# Patient Record
Sex: Female | Born: 1950 | Race: Black or African American | Hispanic: No | Smoking: Never smoker
Health system: Southern US, Community
[De-identification: ages and names within clinical notes are randomized; demographics above are authoritative.]

## PROBLEM LIST (undated history)

## (undated) DIAGNOSIS — E7800 Pure hypercholesterolemia, unspecified: Secondary | ICD-10-CM

## (undated) DIAGNOSIS — E119 Type 2 diabetes mellitus without complications: Secondary | ICD-10-CM

## (undated) DIAGNOSIS — M1990 Unspecified osteoarthritis, unspecified site: Secondary | ICD-10-CM

## (undated) DIAGNOSIS — J45909 Unspecified asthma, uncomplicated: Secondary | ICD-10-CM

## (undated) DIAGNOSIS — M329 Systemic lupus erythematosus, unspecified: Secondary | ICD-10-CM

## (undated) DIAGNOSIS — I10 Essential (primary) hypertension: Secondary | ICD-10-CM

## (undated) DIAGNOSIS — K5792 Diverticulitis of intestine, part unspecified, without perforation or abscess without bleeding: Secondary | ICD-10-CM

## (undated) DIAGNOSIS — IMO0002 Reserved for concepts with insufficient information to code with codable children: Secondary | ICD-10-CM

## (undated) HISTORY — PX: ABDOMINAL HYSTERECTOMY: SHX81

## (undated) HISTORY — PX: ELBOW SURGERY: SHX618

## (undated) HISTORY — PX: EYE SURGERY: SHX253

## (undated) HISTORY — PX: HIP SURGERY: SHX245

## (undated) HISTORY — PX: BLADDER SURGERY: SHX569

## (undated) HISTORY — PX: CARPAL TUNNEL RELEASE: SHX101

---

## 2000-08-18 ENCOUNTER — Encounter: Admission: RE | Admit: 2000-08-18 | Discharge: 2000-08-18 | Payer: Self-pay | Admitting: Internal Medicine

## 2000-10-29 ENCOUNTER — Encounter: Admission: RE | Admit: 2000-10-29 | Discharge: 2000-10-29 | Payer: Self-pay | Admitting: Internal Medicine

## 2000-11-19 ENCOUNTER — Encounter: Admission: RE | Admit: 2000-11-19 | Discharge: 2000-11-19 | Payer: Self-pay | Admitting: Internal Medicine

## 2001-01-28 ENCOUNTER — Encounter: Payer: Self-pay | Admitting: *Deleted

## 2001-01-28 ENCOUNTER — Ambulatory Visit (HOSPITAL_COMMUNITY): Admission: RE | Admit: 2001-01-28 | Discharge: 2001-01-28 | Payer: Self-pay

## 2001-01-28 ENCOUNTER — Encounter: Admission: RE | Admit: 2001-01-28 | Discharge: 2001-01-28 | Payer: Self-pay

## 2001-03-11 ENCOUNTER — Encounter: Admission: RE | Admit: 2001-03-11 | Discharge: 2001-03-11 | Payer: Self-pay | Admitting: Internal Medicine

## 2001-04-15 ENCOUNTER — Encounter: Payer: Self-pay | Admitting: Internal Medicine

## 2001-04-15 ENCOUNTER — Encounter: Admission: RE | Admit: 2001-04-15 | Discharge: 2001-04-15 | Payer: Self-pay | Admitting: Internal Medicine

## 2001-04-15 ENCOUNTER — Ambulatory Visit (HOSPITAL_COMMUNITY): Admission: RE | Admit: 2001-04-15 | Discharge: 2001-04-15 | Payer: Self-pay | Admitting: Internal Medicine

## 2001-05-14 ENCOUNTER — Encounter: Admission: RE | Admit: 2001-05-14 | Discharge: 2001-05-14 | Payer: Self-pay | Admitting: Internal Medicine

## 2001-05-14 ENCOUNTER — Ambulatory Visit (HOSPITAL_COMMUNITY): Admission: RE | Admit: 2001-05-14 | Discharge: 2001-05-14 | Payer: Self-pay | Admitting: Internal Medicine

## 2001-05-15 ENCOUNTER — Ambulatory Visit (HOSPITAL_COMMUNITY): Admission: RE | Admit: 2001-05-15 | Discharge: 2001-05-15 | Payer: Self-pay | Admitting: *Deleted

## 2001-05-15 ENCOUNTER — Encounter: Admission: RE | Admit: 2001-05-15 | Discharge: 2001-05-15 | Payer: Self-pay | Admitting: Internal Medicine

## 2001-05-15 ENCOUNTER — Encounter: Payer: Self-pay | Admitting: *Deleted

## 2001-06-25 ENCOUNTER — Encounter: Payer: Self-pay | Admitting: Sports Medicine

## 2001-06-25 ENCOUNTER — Encounter: Admission: RE | Admit: 2001-06-25 | Discharge: 2001-06-25 | Payer: Self-pay | Admitting: Sports Medicine

## 2001-07-05 ENCOUNTER — Inpatient Hospital Stay (HOSPITAL_COMMUNITY): Admission: EM | Admit: 2001-07-05 | Discharge: 2001-07-08 | Payer: Self-pay | Admitting: Emergency Medicine

## 2001-07-05 ENCOUNTER — Encounter: Payer: Self-pay | Admitting: Emergency Medicine

## 2001-07-07 ENCOUNTER — Encounter: Payer: Self-pay | Admitting: Internal Medicine

## 2001-07-22 ENCOUNTER — Encounter: Admission: RE | Admit: 2001-07-22 | Discharge: 2001-07-22 | Payer: Self-pay

## 2001-07-28 ENCOUNTER — Encounter: Admission: RE | Admit: 2001-07-28 | Discharge: 2001-07-28 | Payer: Self-pay | Admitting: Internal Medicine

## 2001-08-14 ENCOUNTER — Encounter: Admission: RE | Admit: 2001-08-14 | Discharge: 2001-08-14 | Payer: Self-pay | Admitting: Internal Medicine

## 2001-08-17 ENCOUNTER — Encounter: Admission: RE | Admit: 2001-08-17 | Discharge: 2001-08-17 | Payer: Self-pay | Admitting: *Deleted

## 2001-08-17 ENCOUNTER — Encounter: Payer: Self-pay | Admitting: Internal Medicine

## 2001-09-01 ENCOUNTER — Encounter: Admission: RE | Admit: 2001-09-01 | Discharge: 2001-11-30 | Payer: Self-pay | Admitting: *Deleted

## 2001-09-07 ENCOUNTER — Encounter: Admission: RE | Admit: 2001-09-07 | Discharge: 2001-09-07 | Payer: Self-pay | Admitting: Internal Medicine

## 2001-10-13 ENCOUNTER — Encounter: Admission: RE | Admit: 2001-10-13 | Discharge: 2001-10-13 | Payer: Self-pay | Admitting: Internal Medicine

## 2001-11-19 ENCOUNTER — Encounter: Admission: RE | Admit: 2001-11-19 | Discharge: 2001-11-19 | Payer: Self-pay | Admitting: Internal Medicine

## 2001-12-02 ENCOUNTER — Encounter: Payer: Self-pay | Admitting: *Deleted

## 2001-12-02 ENCOUNTER — Encounter: Admission: RE | Admit: 2001-12-02 | Discharge: 2001-12-02 | Payer: Self-pay | Admitting: Internal Medicine

## 2001-12-02 ENCOUNTER — Ambulatory Visit (HOSPITAL_COMMUNITY): Admission: RE | Admit: 2001-12-02 | Discharge: 2001-12-02 | Payer: Self-pay

## 2001-12-20 ENCOUNTER — Encounter: Payer: Self-pay | Admitting: Emergency Medicine

## 2001-12-20 ENCOUNTER — Emergency Department (HOSPITAL_COMMUNITY): Admission: EM | Admit: 2001-12-20 | Discharge: 2001-12-20 | Payer: Self-pay | Admitting: Emergency Medicine

## 2001-12-22 ENCOUNTER — Ambulatory Visit (HOSPITAL_COMMUNITY): Admission: RE | Admit: 2001-12-22 | Discharge: 2001-12-22 | Payer: Self-pay | Admitting: *Deleted

## 2002-02-11 ENCOUNTER — Encounter: Payer: Self-pay | Admitting: Internal Medicine

## 2002-02-11 ENCOUNTER — Ambulatory Visit (HOSPITAL_COMMUNITY): Admission: RE | Admit: 2002-02-11 | Discharge: 2002-02-11 | Payer: Self-pay | Admitting: Internal Medicine

## 2002-03-26 ENCOUNTER — Encounter: Admission: RE | Admit: 2002-03-26 | Discharge: 2002-03-26 | Payer: Self-pay | Admitting: *Deleted

## 2002-03-26 ENCOUNTER — Ambulatory Visit (HOSPITAL_COMMUNITY): Admission: RE | Admit: 2002-03-26 | Discharge: 2002-03-26 | Payer: Self-pay | Admitting: Internal Medicine

## 2002-03-26 ENCOUNTER — Encounter: Payer: Self-pay | Admitting: Internal Medicine

## 2002-03-30 ENCOUNTER — Encounter: Admission: RE | Admit: 2002-03-30 | Discharge: 2002-03-30 | Payer: Self-pay | Admitting: Internal Medicine

## 2002-04-01 ENCOUNTER — Encounter: Payer: Self-pay | Admitting: Internal Medicine

## 2002-04-01 ENCOUNTER — Ambulatory Visit (HOSPITAL_COMMUNITY): Admission: RE | Admit: 2002-04-01 | Discharge: 2002-04-01 | Payer: Self-pay | Admitting: Internal Medicine

## 2002-04-09 ENCOUNTER — Encounter: Admission: RE | Admit: 2002-04-09 | Discharge: 2002-04-09 | Payer: Self-pay | Admitting: Internal Medicine

## 2002-05-01 ENCOUNTER — Emergency Department (HOSPITAL_COMMUNITY): Admission: EM | Admit: 2002-05-01 | Discharge: 2002-05-01 | Payer: Self-pay | Admitting: Emergency Medicine

## 2002-05-01 ENCOUNTER — Encounter: Payer: Self-pay | Admitting: Emergency Medicine

## 2002-05-04 ENCOUNTER — Encounter: Admission: RE | Admit: 2002-05-04 | Discharge: 2002-05-04 | Payer: Self-pay | Admitting: Internal Medicine

## 2013-04-03 ENCOUNTER — Emergency Department (HOSPITAL_BASED_OUTPATIENT_CLINIC_OR_DEPARTMENT_OTHER): Payer: Medicare Other

## 2013-04-03 ENCOUNTER — Encounter (HOSPITAL_BASED_OUTPATIENT_CLINIC_OR_DEPARTMENT_OTHER): Payer: Self-pay | Admitting: *Deleted

## 2013-04-03 ENCOUNTER — Emergency Department (HOSPITAL_BASED_OUTPATIENT_CLINIC_OR_DEPARTMENT_OTHER)
Admission: EM | Admit: 2013-04-03 | Discharge: 2013-04-03 | Disposition: A | Discharge status: Home / Self Care | Payer: Medicare Other | Attending: Emergency Medicine | Admitting: Emergency Medicine

## 2013-04-03 DIAGNOSIS — Z8719 Personal history of other diseases of the digestive system: Secondary | ICD-10-CM | POA: Insufficient documentation

## 2013-04-03 DIAGNOSIS — R159 Full incontinence of feces: Secondary | ICD-10-CM | POA: Insufficient documentation

## 2013-04-03 DIAGNOSIS — Z8739 Personal history of other diseases of the musculoskeletal system and connective tissue: Secondary | ICD-10-CM | POA: Insufficient documentation

## 2013-04-03 DIAGNOSIS — E119 Type 2 diabetes mellitus without complications: Secondary | ICD-10-CM | POA: Insufficient documentation

## 2013-04-03 DIAGNOSIS — I10 Essential (primary) hypertension: Secondary | ICD-10-CM | POA: Insufficient documentation

## 2013-04-03 DIAGNOSIS — IMO0001 Reserved for inherently not codable concepts without codable children: Secondary | ICD-10-CM | POA: Insufficient documentation

## 2013-04-03 DIAGNOSIS — T466X5A Adverse effect of antihyperlipidemic and antiarteriosclerotic drugs, initial encounter: Secondary | ICD-10-CM | POA: Insufficient documentation

## 2013-04-03 DIAGNOSIS — K921 Melena: Secondary | ICD-10-CM | POA: Insufficient documentation

## 2013-04-03 DIAGNOSIS — R252 Cramp and spasm: Secondary | ICD-10-CM | POA: Insufficient documentation

## 2013-04-03 DIAGNOSIS — T50905A Adverse effect of unspecified drugs, medicaments and biological substances, initial encounter: Secondary | ICD-10-CM

## 2013-04-03 DIAGNOSIS — E7800 Pure hypercholesterolemia, unspecified: Secondary | ICD-10-CM | POA: Insufficient documentation

## 2013-04-03 DIAGNOSIS — J45909 Unspecified asthma, uncomplicated: Secondary | ICD-10-CM | POA: Insufficient documentation

## 2013-04-03 HISTORY — DX: Diverticulitis of intestine, part unspecified, without perforation or abscess without bleeding: K57.92

## 2013-04-03 HISTORY — DX: Essential (primary) hypertension: I10

## 2013-04-03 HISTORY — DX: Type 2 diabetes mellitus without complications: E11.9

## 2013-04-03 HISTORY — DX: Reserved for concepts with insufficient information to code with codable children: IMO0002

## 2013-04-03 HISTORY — DX: Unspecified asthma, uncomplicated: J45.909

## 2013-04-03 HISTORY — DX: Pure hypercholesterolemia, unspecified: E78.00

## 2013-04-03 HISTORY — DX: Unspecified osteoarthritis, unspecified site: M19.90

## 2013-04-03 HISTORY — DX: Systemic lupus erythematosus, unspecified: M32.9

## 2013-04-03 LAB — COMPREHENSIVE METABOLIC PANEL
Albumin: 4 g/dL (ref 3.5–5.2)
Alkaline Phosphatase: 119 U/L — ABNORMAL HIGH (ref 39–117)
BUN: 24 mg/dL — ABNORMAL HIGH (ref 6–23)
Creatinine, Ser: 1.2 mg/dL — ABNORMAL HIGH (ref 0.50–1.10)
GFR calc Af Amer: 55 mL/min — ABNORMAL LOW (ref >90)
Glucose, Bld: 93 mg/dL (ref 70–99)
Potassium: 4.1 mEq/L (ref 3.5–5.1)
Total Bilirubin: 0.2 mg/dL — ABNORMAL LOW (ref 0.3–1.2)
Total Protein: 7 g/dL (ref 6.0–8.3)

## 2013-04-03 LAB — CBC WITH DIFFERENTIAL/PLATELET
Basophils Relative: 0 % (ref 0–1)
Eosinophils Absolute: 0.1 10*3/uL (ref 0.0–0.7)
HCT: 36.2 % (ref 36.0–46.0)
Hemoglobin: 12 g/dL (ref 12.0–15.0)
Lymphs Abs: 2.4 10*3/uL (ref 0.7–4.0)
MCH: 27.7 pg (ref 26.0–34.0)
MCHC: 33.1 g/dL (ref 30.0–36.0)
MCV: 83.6 fL (ref 78.0–100.0)
Monocytes Absolute: 1.2 10*3/uL — ABNORMAL HIGH (ref 0.1–1.0)
Monocytes Relative: 12 % (ref 3–12)
Neutrophils Relative %: 63 % (ref 43–77)
RBC: 4.33 MIL/uL (ref 3.87–5.11)

## 2013-04-03 LAB — URINALYSIS, ROUTINE W REFLEX MICROSCOPIC
Hgb urine dipstick: NEGATIVE
Nitrite: NEGATIVE
Protein, ur: NEGATIVE mg/dL
Urobilinogen, UA: 1 mg/dL (ref 0.0–1.0)

## 2013-04-03 LAB — OCCULT BLOOD X 1 CARD TO LAB, STOOL: Fecal Occult Bld: NEGATIVE

## 2013-04-03 LAB — CK: Total CK: 273 U/L — ABNORMAL HIGH (ref 7–177)

## 2013-04-03 LAB — GLUCOSE, CAPILLARY: Glucose-Capillary: 112 mg/dL — ABNORMAL HIGH (ref 70–99)

## 2013-04-03 MED ORDER — HYDROCODONE-ACETAMINOPHEN 5-325 MG PO TABS
1.0000 | ORAL_TABLET | Freq: Once | ORAL | Status: AC
  Administered 2013-04-03: 1 via ORAL
  Filled 2013-04-03: qty 1

## 2013-04-03 MED ORDER — HYDROCODONE-ACETAMINOPHEN 5-325 MG PO TABS: 1.0000 | ORAL_TABLET | ORAL | Status: DC | PRN

## 2013-04-03 NOTE — ED Notes (Signed)
Patient transported to X-ray, ambulatory with tech. 

## 2013-04-03 NOTE — ED Notes (Signed)
PA at bedside.

## 2013-04-03 NOTE — ED Provider Notes (Addendum)
History    CSN: [id number] Arrival date & time 04/03/13  1910  First MD Initiated Contact with Patient 04/03/13 2113     Chief Complaint  Patient presents with  . Medication Reaction   (Consider location/radiation/quality/duration/timing/severity/associated sxs/prior Treatment) Patient is a 62 y.o. female presenting with musculoskeletal pain. The history is provided by the patient. No language interpreter was used.  Muscle Pain Associated symptoms include myalgias. Pertinent negatives include no abdominal pain, chest pain, chills, fever or nausea. Associated symptoms comments: She presents with complaint of generalized muscle cramping and pain without fever, N, V. She has also had dark stool with question of some blood. She reports incontinence of bowel but that is not new. She has been on Pravachol for the past 2 years with a dosing increase 2 months ago. Current symptoms started after medication change and continue to worsen..   Past Medical History  Diagnosis Date  . Diabetes mellitus without complication   . Diverticulitis   . Hypertension   . Lupus   . Arthritis   . Asthma   . Hypercholesteremia    Past Surgical History  Procedure Laterality Date  . Abdominal hysterectomy    . Carpal tunnel release    . Elbow surgery    . Bladder surgery    . Eye surgery    . Hip surgery     History reviewed. No pertinent family history. History  Substance Use Topics  . Smoking status: Never Smoker   . Smokeless tobacco: Not on file  . Alcohol Use: No   OB History   Grav Para Term Preterm Abortions TAB SAB Ect Mult Living                 Review of Systems  Constitutional: Negative for fever and chills.  Respiratory: Negative.  Negative for shortness of breath.   Cardiovascular: Negative.  Negative for chest pain.  Gastrointestinal: Positive for blood in stool. Negative for nausea and abdominal pain.  Musculoskeletal: Positive for myalgias.  Skin: Negative.   Neurological:  Negative.     Allergies  Review of patient's allergies indicates not on file.  Home Medications  No current outpatient prescriptions on file. BP 141/78  Pulse 94  Temp(Src) 98.5 F (36.9 C) (Oral)  Resp 20  Ht 5\' 2"  (1.575 m)  Wt 160 lb (72.576 kg)  BMI 29.26 kg/m2  SpO2 100% Physical Exam  Constitutional: She appears well-developed and well-nourished.  HENT:  Head: Normocephalic.  Neck: Normal range of motion. Neck supple.  Cardiovascular: Normal rate, regular rhythm and intact distal pulses.   Pulmonary/Chest: Effort normal and breath sounds normal.  Abdominal: Soft. Bowel sounds are normal. There is no tenderness. There is no rebound and no guarding.  Musculoskeletal: Normal range of motion.  FROM all extremities. No focal palpable tenderness. No redness, inflammation or lesion.  Neurological: She is alert.  Skin: Skin is warm and dry. No rash noted.  Psychiatric: She has a normal mood and affect.    ED Course  Procedures (including critical care time) Labs Reviewed  GLUCOSE, CAPILLARY - Abnormal; Notable for the following:    Glucose-Capillary 112 (*)    All other components within normal limits  CBC WITH DIFFERENTIAL - Abnormal; Notable for the following:    Monocytes Absolute 1.2 (*)    All other components within normal limits  COMPREHENSIVE METABOLIC PANEL - Abnormal; Notable for the following:    BUN 24 (*)    Creatinine, Ser 1.20 (*)  Alkaline Phosphatase 119 (*)    Total Bilirubin 0.2 (*)    GFR calc non Af Amer 47 (*)    GFR calc Af Amer 55 (*)    All other components within normal limits  OCCULT BLOOD X 1 CARD TO LAB, STOOL  CK  URINALYSIS, ROUTINE W REFLEX MICROSCOPIC   Dg Abd Acute W/chest  04/03/2013   *RADIOLOGY REPORT*  Clinical Data: Right lower quadrant pain.  Medication reaction.  ACUTE ABDOMEN SERIES (ABDOMEN 2 VIEW & CHEST 1 VIEW)  Comparison: CT abdomen and pelvis 12/17/2012.  Chest x-ray 11/23/2012.  Findings: Mild pulmonary  hyperinflation.  Stable fibrosis or atelectasis in the lung bases.  Stable appearance of central venous catheter. The heart size and pulmonary vascularity are normal. The lungs appear clear and expanded without focal air space disease or consolidation. No blunting of the costophrenic angles.  No pneumothorax.  Mediastinal contours appear intact.  Calcified and tortuous aorta.  No significant change since previous chest radiograph.  Scattered gas and stool in the colon.  No small or large bowel distension.  No free intra-abdominal air.  No abnormal air fluid levels.  No radiopaque stones.  Vascular calcifications.  Calcified phleboliths in the pelvis.  Degenerative changes in the lumbar spine and hips.  Postoperative changes in the left hip.  Old deformity of the lateral left pelvic bone is likely due to old trauma or postoperative change.  This is stable since previous CT scan.  IMPRESSION: No evidence of active pulmonary disease.  Nonobstructive bowel gas pattern.   Original Report Authenticated By: Burman Nieves, M.D.   No diagnosis found. 1. Adverse medication reaction MDM  Mildly elevated CK in the setting of increased statin dose with muscular cramping and pain - suspect symptoms are largely due to medication. Recommend stopping Pravachol and PCP follow up. Stable for discharge.   Arnoldo Hooker, PA-C 04/03/13 2339  Arnoldo Hooker, PA-C 04/14/13 1057

## 2013-04-03 NOTE — ED Notes (Addendum)
Pt states that the Dr. Increased her cholesterol medicine (Pravastatin 80 mg) and she noticed that she began having blurred vision, muscle spasms, incontinent of stool at times x 1 week. FSBS done at triage.

## 2013-04-03 NOTE — ED Notes (Signed)
Patient ambulatory to Xray.

## 2013-04-04 NOTE — ED Provider Notes (Signed)
Medical screening examination/treatment/procedure(s) were performed by non-physician practitioner and as supervising physician I was immediately available for consultation/collaboration.  Braylon Lemmons T Alvah Gilder, MD 04/04/13 2028 

## 2013-04-14 NOTE — ED Provider Notes (Signed)
Medical screening examination/treatment/procedure(s) were performed by non-physician practitioner and as supervising physician I was immediately available for consultation/collaboration.  Mirko Tailor T Jathniel Smeltzer, MD 04/14/13 1532 

## 2014-07-21 ENCOUNTER — Emergency Department (HOSPITAL_BASED_OUTPATIENT_CLINIC_OR_DEPARTMENT_OTHER)
Admission: EM | Admit: 2014-07-21 | Discharge: 2014-07-21 | Disposition: A | Discharge status: Home / Self Care | Payer: Medicare Other | Attending: Emergency Medicine | Admitting: Emergency Medicine

## 2014-07-21 ENCOUNTER — Emergency Department (HOSPITAL_BASED_OUTPATIENT_CLINIC_OR_DEPARTMENT_OTHER): Payer: Medicare Other

## 2014-07-21 ENCOUNTER — Encounter (HOSPITAL_BASED_OUTPATIENT_CLINIC_OR_DEPARTMENT_OTHER): Payer: Self-pay | Admitting: Emergency Medicine

## 2014-07-21 DIAGNOSIS — S3992XA Unspecified injury of lower back, initial encounter: Secondary | ICD-10-CM | POA: Insufficient documentation

## 2014-07-21 DIAGNOSIS — E119 Type 2 diabetes mellitus without complications: Secondary | ICD-10-CM | POA: Insufficient documentation

## 2014-07-21 DIAGNOSIS — Z880 Allergy status to penicillin: Secondary | ICD-10-CM | POA: Insufficient documentation

## 2014-07-21 DIAGNOSIS — M1990 Unspecified osteoarthritis, unspecified site: Secondary | ICD-10-CM | POA: Insufficient documentation

## 2014-07-21 DIAGNOSIS — E780 Pure hypercholesterolemia: Secondary | ICD-10-CM | POA: Diagnosis not present

## 2014-07-21 DIAGNOSIS — J45909 Unspecified asthma, uncomplicated: Secondary | ICD-10-CM | POA: Diagnosis not present

## 2014-07-21 DIAGNOSIS — Z794 Long term (current) use of insulin: Secondary | ICD-10-CM | POA: Insufficient documentation

## 2014-07-21 DIAGNOSIS — S79911A Unspecified injury of right hip, initial encounter: Secondary | ICD-10-CM | POA: Insufficient documentation

## 2014-07-21 DIAGNOSIS — Z79899 Other long term (current) drug therapy: Secondary | ICD-10-CM | POA: Insufficient documentation

## 2014-07-21 DIAGNOSIS — Z8719 Personal history of other diseases of the digestive system: Secondary | ICD-10-CM | POA: Insufficient documentation

## 2014-07-21 DIAGNOSIS — Y92410 Unspecified street and highway as the place of occurrence of the external cause: Secondary | ICD-10-CM | POA: Diagnosis not present

## 2014-07-21 DIAGNOSIS — I10 Essential (primary) hypertension: Secondary | ICD-10-CM | POA: Diagnosis not present

## 2014-07-21 DIAGNOSIS — M25551 Pain in right hip: Secondary | ICD-10-CM

## 2014-07-21 DIAGNOSIS — Y9389 Activity, other specified: Secondary | ICD-10-CM | POA: Diagnosis not present

## 2014-07-21 MED ORDER — MORPHINE SULFATE 4 MG/ML IJ SOLN
4.0000 mg | Freq: Once | INTRAMUSCULAR | Status: AC
  Administered 2014-07-21: 4 mg via INTRAMUSCULAR
  Filled 2014-07-21: qty 1
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
## 2014-07-21 NOTE — ED Notes (Signed)
C/o back pain x 2 weeks  Starting between shoulder blades and funning to lower back radiating to rt hip w tingling to rt leg  States getting worse

## 2014-07-21 NOTE — Discharge Instructions (Signed)
Continue taking your pain meds at home.   Follow up with your neurosurgeon.   Return to ER if you have severe back pain, unable to walk, numbness, trouble urinating.

## 2014-07-21 NOTE — ED Notes (Signed)
Patient transported to X-ray 
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
## 2014-11-25 ENCOUNTER — Other Ambulatory Visit: Payer: Self-pay | Admitting: Neurosurgery

## 2014-11-25 DIAGNOSIS — M5441 Lumbago with sciatica, right side: Secondary | ICD-10-CM

## 2014-11-28 ENCOUNTER — Ambulatory Visit
Admission: RE | Admit: 2014-11-28 | Discharge: 2014-11-28 | Disposition: A | Discharge status: Home / Self Care | Payer: Medicare Other | Source: Ambulatory Visit | Attending: Neurosurgery | Admitting: Neurosurgery

## 2014-11-28 VITALS — BP 148/72 | HR 94

## 2014-11-28 DIAGNOSIS — M5441 Lumbago with sciatica, right side: Secondary | ICD-10-CM

## 2014-11-28 DIAGNOSIS — M545 Low back pain: Secondary | ICD-10-CM

## 2014-11-28 MED ORDER — METHYLPREDNISOLONE ACETATE 40 MG/ML INJ SUSP (RADIOLOG
120.0000 mg | Freq: Once | INTRAMUSCULAR | Status: AC
  Administered 2014-11-28: 120 mg via EPIDURAL

## 2014-11-28 MED ORDER — IOHEXOL 180 MG/ML  SOLN
1.0000 mL | Freq: Once | INTRAMUSCULAR | Status: AC | PRN
  Administered 2014-11-28: 1 mL via EPIDURAL

## 2014-11-28 NOTE — Discharge Instructions (Signed)

## 2015-11-07 DIAGNOSIS — E1142 Type 2 diabetes mellitus with diabetic polyneuropathy: Secondary | ICD-10-CM | POA: Insufficient documentation

## 2017-01-18 ENCOUNTER — Encounter (HOSPITAL_BASED_OUTPATIENT_CLINIC_OR_DEPARTMENT_OTHER): Payer: Self-pay | Admitting: Emergency Medicine

## 2017-01-18 ENCOUNTER — Emergency Department (HOSPITAL_BASED_OUTPATIENT_CLINIC_OR_DEPARTMENT_OTHER): Payer: Medicare Other

## 2017-01-18 ENCOUNTER — Emergency Department (HOSPITAL_BASED_OUTPATIENT_CLINIC_OR_DEPARTMENT_OTHER)
Admission: EM | Admit: 2017-01-18 | Discharge: 2017-01-19 | Disposition: A | Discharge status: Home / Self Care | Payer: Medicare Other | Attending: Emergency Medicine | Admitting: Emergency Medicine

## 2017-01-18 DIAGNOSIS — R0602 Shortness of breath: Secondary | ICD-10-CM | POA: Insufficient documentation

## 2017-01-18 DIAGNOSIS — R05 Cough: Secondary | ICD-10-CM | POA: Insufficient documentation

## 2017-01-18 DIAGNOSIS — Z794 Long term (current) use of insulin: Secondary | ICD-10-CM | POA: Diagnosis not present

## 2017-01-18 DIAGNOSIS — M2550 Pain in unspecified joint: Secondary | ICD-10-CM

## 2017-01-18 DIAGNOSIS — E119 Type 2 diabetes mellitus without complications: Secondary | ICD-10-CM | POA: Diagnosis not present

## 2017-01-18 DIAGNOSIS — J45909 Unspecified asthma, uncomplicated: Secondary | ICD-10-CM | POA: Diagnosis not present

## 2017-01-18 DIAGNOSIS — K5900 Constipation, unspecified: Secondary | ICD-10-CM | POA: Diagnosis not present

## 2017-01-18 DIAGNOSIS — M791 Myalgia: Secondary | ICD-10-CM | POA: Insufficient documentation

## 2017-01-18 DIAGNOSIS — I10 Essential (primary) hypertension: Secondary | ICD-10-CM | POA: Insufficient documentation

## 2017-01-18 DIAGNOSIS — R52 Pain, unspecified: Secondary | ICD-10-CM

## 2017-01-18 LAB — COMPREHENSIVE METABOLIC PANEL
ALT: 14 U/L (ref 14–54)
ANION GAP: 7 (ref 5–15)
AST: 23 U/L (ref 15–41)
Albumin: 3.6 g/dL (ref 3.5–5.0)
Alkaline Phosphatase: 82 U/L (ref 38–126)
BILIRUBIN TOTAL: 0.5 mg/dL (ref 0.3–1.2)
BUN: 10 mg/dL (ref 6–20)
CALCIUM: 8.9 mg/dL (ref 8.9–10.3)
CO2: 27 mmol/L (ref 22–32)
Chloride: 106 mmol/L (ref 101–111)
Creatinine, Ser: 0.95 mg/dL (ref 0.44–1.00)
GFR calc non Af Amer: >60 mL/min (ref >60)
Glucose, Bld: 69 mg/dL (ref 65–99)
Potassium: 3.7 mmol/L (ref 3.5–5.1)
Sodium: 140 mmol/L (ref 135–145)
TOTAL PROTEIN: 6.1 g/dL — AB (ref 6.5–8.1)

## 2017-01-18 LAB — CBC WITH DIFFERENTIAL/PLATELET
BASOS ABS: 0 10*3/uL (ref 0.0–0.1)
Basophils Relative: 0 %
EOS ABS: 0.2 10*3/uL (ref 0.0–0.7)
EOS PCT: 4 %
HCT: 34.7 % — ABNORMAL LOW (ref 36.0–46.0)
Hemoglobin: 11.4 g/dL — ABNORMAL LOW (ref 12.0–15.0)
LYMPHS PCT: 35 %
Lymphs Abs: 2 10*3/uL (ref 0.7–4.0)
MCH: 27.2 pg (ref 26.0–34.0)
MCHC: 32.9 g/dL (ref 30.0–36.0)
MCV: 82.8 fL (ref 78.0–100.0)
MONO ABS: 1.3 10*3/uL — AB (ref 0.1–1.0)
Monocytes Relative: 22 %
Neutro Abs: 2.3 10*3/uL (ref 1.7–7.7)
Neutrophils Relative %: 39 %
PLATELETS: 192 10*3/uL (ref 150–400)
RBC: 4.19 MIL/uL (ref 3.87–5.11)
RDW: 13.2 % (ref 11.5–15.5)
WBC: 5.9 10*3/uL (ref 4.0–10.5)

## 2017-01-18 LAB — URINALYSIS, ROUTINE W REFLEX MICROSCOPIC
Bilirubin Urine: NEGATIVE
Glucose, UA: NEGATIVE mg/dL
Hgb urine dipstick: NEGATIVE
KETONES UR: NEGATIVE mg/dL
Leukocytes, UA: NEGATIVE
Nitrite: NEGATIVE
PROTEIN: NEGATIVE mg/dL
Specific Gravity, Urine: 1.021 (ref 1.005–1.030)
pH: 5.5 (ref 5.0–8.0)

## 2017-01-18 MED ORDER — OXYCODONE-ACETAMINOPHEN 5-325 MG PO TABS
1.0000 | ORAL_TABLET | Freq: Once | ORAL | Status: AC
  Administered 2017-01-18: 1 via ORAL
  Filled 2017-01-18: qty 1

## 2017-01-18 NOTE — ED Provider Notes (Signed)
[facility] Note   CSN: [id number] Arrival date & time: 01/18/17  1922   By signing my name below, I, Clarisse Gouge, attest that this documentation has been prepared under the direction and in the presence of Laurence Spates, MD. Electronically signed, Clarisse Gouge, ED Scribe. 01/18/17. 9:28 PM.   History   Chief Complaint Chief Complaint  Patient presents with  . Generalized Body Aches   The history is provided by the patient and medical records. No language interpreter was used.    Tracey Murphy is a 66 y.o. female with h/o lupus, DM, HLD and RA, transported via private vehicle to the Emergency Department with concern for generalized x 1 month that worsened today. SOB, finger swelling, dry cough, headaches, decreased appetite, decreased mobility d/t joint pain, chills, worsening cough, constipation, abdominal pain, difficulty swallowing described as food getting stuck in the throat x 1 year. She states her symptoms have been ongoing and worsening x 1 month. States her blood sugars have been regulated during her symptoms. Pt taking hydrocodone QID for RA; she states she was prescribed Cipro on 01/16/2017 for an unknown issue by her PCP. Currently maintained on  prednisone daily. No fever diarrhea dysuria hematuria, numbness, weakness, rash or any other complaints noted at this time.  Past Medical History:  Diagnosis Date  . Arthritis   . Asthma   . Diabetes mellitus without complication (HCC)   . Diverticulitis   . Hypercholesteremia   . Hypertension   . Lupus     There are no active problems to display for this patient.   Past Surgical History:  Procedure Laterality Date  . ABDOMINAL HYSTERECTOMY    . BLADDER SURGERY    . CARPAL TUNNEL RELEASE    . ELBOW SURGERY    . EYE SURGERY    . HIP SURGERY      OB History    No data available       Home Medications    Prior to Admission medications   Medication Sig Start Date End Date  Taking? Authorizing Provider  azaTHIOprine (IMURAN) 50 MG tablet Take 50 mg by mouth daily.    Historical Provider, MD  bumetanide (BUMEX) 1 MG tablet Take 1 mg by mouth daily.    Historical Provider, MD  carvedilol (COREG) 3.125 MG tablet Take 3.125 mg by mouth 2 (two) times daily with a meal.    Historical Provider, MD  diclofenac sodium (VOLTAREN) 1 % GEL Apply topically 4 (four) times daily.    Historical Provider, MD  esomeprazole (NEXIUM) 40 MG capsule Take 40 mg by mouth daily at 12 noon.    Historical Provider, MD  estradiol (ESTRACE) 0.5 MG tablet Take 0.5 mg by mouth daily.    Historical Provider, MD  fluconazole (DIFLUCAN) 100 MG tablet Take 100 mg by mouth daily.    Historical Provider, MD  gabapentin (NEURONTIN) 400 MG capsule Take 400 mg by mouth 3 (three) times daily.    Historical Provider, MD  HYDROcodone-acetaminophen (NORCO/VICODIN) 5-325 MG per tablet Take 1 tablet by mouth every 4 (four) hours as needed for pain. 04/03/13   Elpidio Anis, PA-C  hydroxychloroquine (PLAQUENIL) 200 MG tablet Take 200 mg by mouth daily.    Historical Provider, MD  insulin glargine (LANTUS) 100 UNIT/ML injection Inject into the skin at bedtime.    Historical Provider, MD  insulin lispro (HUMALOG) 100 UNIT/ML injection Inject into the skin 3 (three) times daily before meals.    Historical Provider,  MD  montelukast (SINGULAIR) 10 MG tablet Take 10 mg by mouth at bedtime.    Historical Provider, MD  OXYBUTYNIN CHLORIDE PO Take by mouth.    Historical Provider, MD  predniSONE (DELTASONE) 5 MG tablet Take 5 mg by mouth daily with breakfast.    Historical Provider, MD  ranitidine (ZANTAC) 300 MG capsule Take 300 mg by mouth every evening.    Historical Provider, MD  rosuvastatin (CRESTOR) 40 MG tablet Take 40 mg by mouth daily.    Historical Provider, MD  sitaGLIPtin-metformin (JANUMET) 50-500 MG per tablet Take 1 tablet by mouth 2 (two) times daily with a meal.    Historical Provider, MD    Family  History No family history on file.  Social History Social History  Substance Use Topics  . Smoking status: Never Smoker  . Smokeless tobacco: Not on file  . Alcohol use No     Allergies   Codeine and Penicillins   Review of Systems Review of Systems  Respiratory: Positive for cough and shortness of breath.   Gastrointestinal: Positive for abdominal pain and constipation.  Musculoskeletal: Positive for arthralgias and myalgias.  All other systems reviewed and are negative.    Physical Exam Updated Vital Signs BP 127/77 (BP Location: Left Arm)   Pulse 96   Temp 98.5 F (36.9 C) (Oral)   Resp 20   Ht  (1.575 m)   Wt 152 lb (68.9 kg)   SpO2 94%   BMI 27.80 kg/m   Physical Exam  Constitutional: She is oriented to person, place, and time. She appears well-developed and well-nourished. No distress.  HENT:  Head: Normocephalic and atraumatic.  Mouth/Throat: Oropharynx is clear and moist.  Moist mucous membranes  Eyes: Conjunctivae are normal. Pupils are equal, round, and reactive to light.  Neck: Neck supple.  Cardiovascular: Normal rate, regular rhythm and normal heart sounds.   No murmur heard. Pulmonary/Chest: Effort normal and breath sounds normal.  Abdominal: Soft. Bowel sounds are normal. She exhibits no distension. There is tenderness (mid epigastric TTP) in the epigastric area.  Musculoskeletal: She exhibits no edema.  Chronic deformities of DIP and PIP joints on hands  Neurological: She is alert and oriented to person, place, and time. No sensory deficit.  Fluent speech, 5/5 strength x all 4 extremities  Skin: Skin is warm and dry.  Psychiatric: She has a normal mood and affect. Judgment normal.  Nursing note and vitals reviewed.    ED Treatments / Results  DIAGNOSTIC STUDIES: Oxygen Saturation is 94% on RA, adequate by my interpretation.    COORDINATION OF CARE: 9:23 PM-Discussed next steps with pt. Pt verbalized understanding and is agreeable  with the plan. Will order medication, imaging and labs.   Labs (all labs ordered are listed, but only abnormal results are displayed) Labs Reviewed  COMPREHENSIVE METABOLIC PANEL - Abnormal; Notable for the following:       Result Value   Total Protein 6.1 (*)    All other components within normal limits  CBC WITH DIFFERENTIAL/PLATELET - Abnormal; Notable for the following:    Hemoglobin 11.4 (*)    HCT 34.7 (*)    Monocytes Absolute 1.3 (*)    All other components within normal limits  URINALYSIS, ROUTINE W REFLEX MICROSCOPIC    EKG  EKG Interpretation  Date/Time:  Saturday January 18 2017 19:32:01 EDT Ventricular Rate:  93 PR Interval:  134 QRS Duration: 66 QT Interval:  378 QTC Calculation: 469 R Axis:   -3  Text Interpretation:  Normal sinus rhythm Low voltage QRS Cannot rule out Anterior infarct , age undetermined Abnormal ECG No previous ECGs available Confirmed by Laveda Demedeiros MD, Gonzalo Waymire [phone]) on 01/18/2017 8:40:02 PM       Radiology Dg Chest 2 View  Result Date: 01/18/2017 CLINICAL DATA:  Diffuse myalgias. Dizziness. Weakness. Decreased appetite for the past month. EXAM: CHEST  2 VIEW COMPARISON:  03/01/2016. FINDINGS: Normal sized heart. Mild linear scarring at both lung bases without significant change. Stable mild diaphragmatic flattening and mild central peribronchial thickening. Tortuous and calcified thoracic aorta. Thoracic and upper lumbar spine degenerative changes. IMPRESSION: No acute abnormality. Stable mild changes of COPD and chronic bronchitis with bibasilar linear scarring Electronically Signed   By: Beckie Salts M.D.   On: 01/18/2017 22:11    Procedures Procedures (including critical care time)  Medications Ordered in ED Medications  oxyCODONE-acetaminophen (PERCOCET/ROXICET) 5-325 MG per tablet 1 tablet (1 tablet Oral Given 01/18/17 2126)     Initial Impression / Assessment and Plan / ED Course  I have reviewed the triage vital signs and the nursing  notes.  Pertinent labs & imaging results that were available during my care of the patient were reviewed by me and considered in my medical decision making (see chart for details).     PT w/ History of SLE and rheumatoid arthritis followed by rheumatology presents with multiple complaints including body aches, joint pains, and decreased appetite for at least one month. Mild cough but no respiratory complaints. On exam she was well-appearing with normal vital signs. Clear breath sounds. No focal joint swelling, rather she complained of diffuse body pain. She had mild midepigastric tenderness on exam but denied any abdominal pain at rest and no vomiting or other GI symptoms. Her basic lab work here is reassuring with no evidence of dehydration or infection, normal WBC count. Chest x-ray negative for acute process. After receiving 1 dose of Percocet, on reexamination she appeared well and felt comfortable. Instructed to contact her rheumatologist regarding her ongoing joint pains and reviewed return precautions including fever, difficulty breathing, abdominal pain. Patient voiced understanding and was discharged in satisfactory condition.  Final Clinical Impressions(s) / ED Diagnoses   Final diagnoses:  Body aches  Arthralgia, unspecified joint    New Prescriptions New Prescriptions   No medications on file  I personally performed the services described in this documentation, which was scribed in my presence. The recorded information has been reviewed and is accurate.    Laurence Spates, MD 01/18/17 [phone]

## 2017-01-18 NOTE — ED Notes (Signed)
ED Provider at bedside. 

## 2017-01-18 NOTE — ED Triage Notes (Addendum)
Pt presents to ED with complaints of generalized body aches, dizziness ,  trouble swallowing, weakness, and decreased appetite.

## 2017-01-18 NOTE — ED Notes (Signed)
Spoke with EDP - ok for patient to have food. Brought patient cheese, crackers & soda

## 2017-01-18 NOTE — Discharge Instructions (Signed)
Contact your rheumatologist for a follow-up appointment regarding your ongoing joint pains and body aches. If you develop any breathing problems, fever, vomiting, or abdominal pain please seek immediate medical attention.

## 2017-11-03 IMAGING — DX DG CHEST 2V
2 series · 2 of 2 positions shown · non-contrast
Comparison: 03/01/2016.

CLINICAL DATA: Diffuse myalgias. Dizziness. Weakness. Decreased
appetite for the past month.

EXAM:
CHEST  2 VIEW

[chest pa]
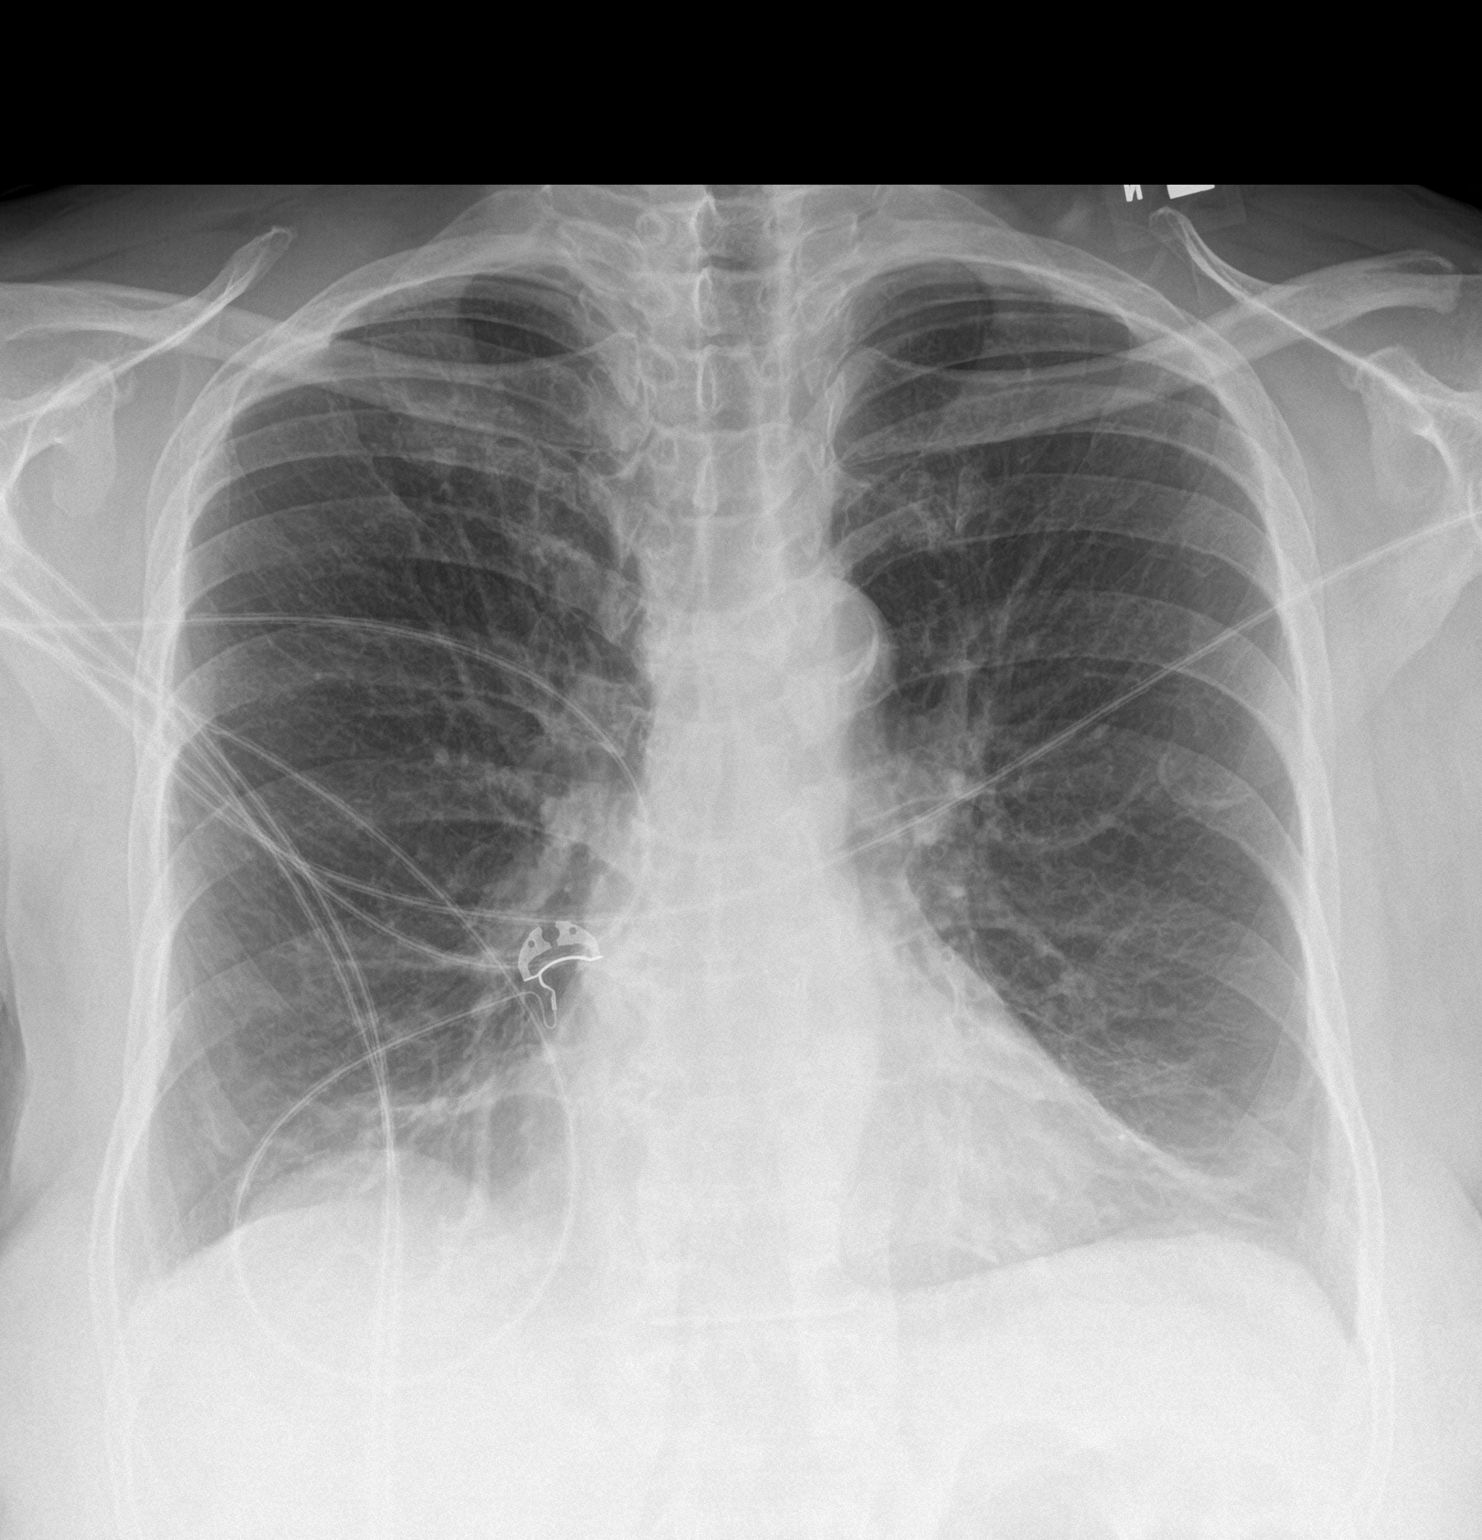

[chest lat]
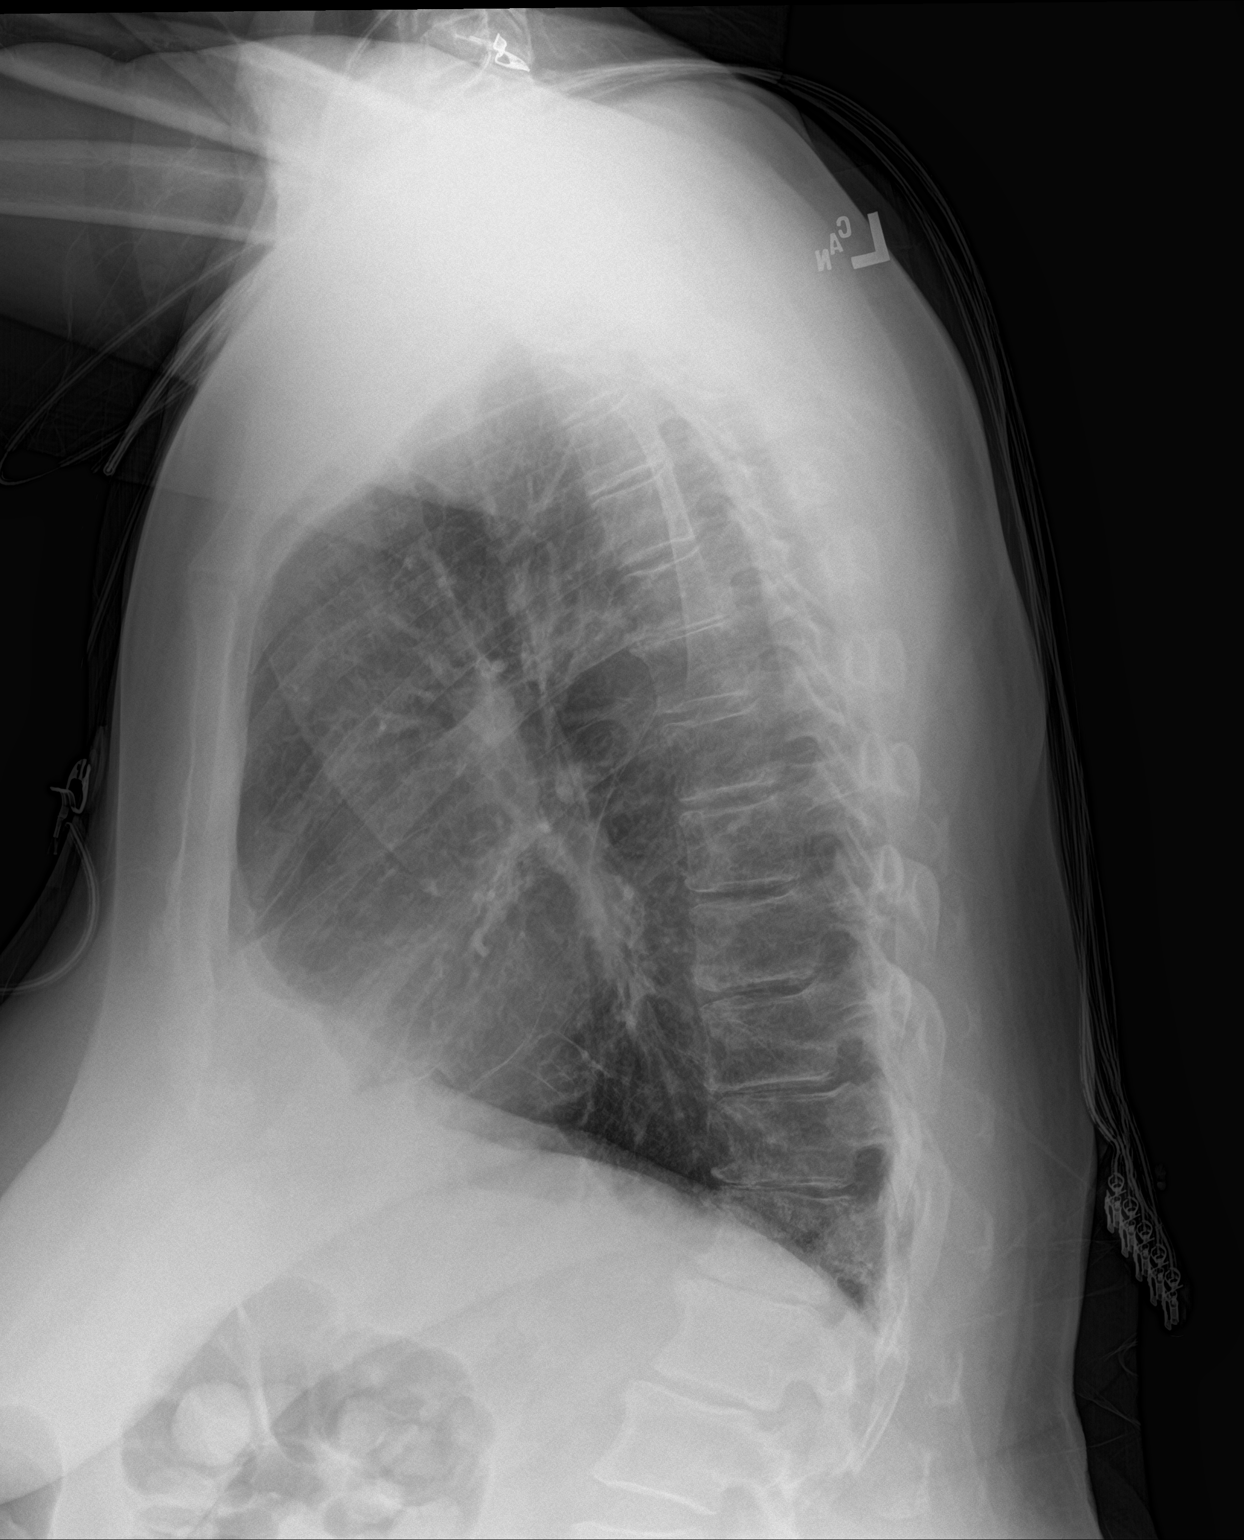

[2 of 2 positions shown; findings below may reference images not displayed]

FINDINGS: Normal sized heart. Mild linear scarring at both lung bases without
significant change. Stable mild diaphragmatic flattening and mild
central peribronchial thickening. Tortuous and calcified thoracic
aorta. Thoracic and upper lumbar spine degenerative changes.
IMPRESSION: No acute abnormality. Stable mild changes of COPD and chronic
bronchitis with bibasilar linear scarring

## 2020-07-15 ENCOUNTER — Ambulatory Visit: Payer: Medicare Other | Attending: Internal Medicine

## 2020-07-15 DIAGNOSIS — Z23 Encounter for immunization: Secondary | ICD-10-CM

## 2020-07-15 NOTE — Progress Notes (Signed)
   Covid-19 Vaccination Clinic  Name:  [name]    MRN: [id number] DOB: 02/27/1951  07/15/2020  [name] was observed post Covid-19 immunization for 15 minutes without incident. She was provided with Vaccine Information Sheet and instruction to access the V-Safe system.   [name] was instructed to call 911 with any severe reactions post vaccine: Marland Kitchen Difficulty breathing  . Swelling of face and throat  . A fast heartbeat  . A bad rash all over body  . Dizziness and weakness

## 2021-11-10 ENCOUNTER — Emergency Department (HOSPITAL_BASED_OUTPATIENT_CLINIC_OR_DEPARTMENT_OTHER)
Admission: EM | Admit: 2021-11-10 | Discharge: 2021-11-10 | Disposition: A | Discharge status: Home / Self Care | Payer: 59 | Attending: Emergency Medicine | Admitting: Emergency Medicine

## 2021-11-10 ENCOUNTER — Other Ambulatory Visit: Payer: Self-pay

## 2021-11-10 ENCOUNTER — Encounter (HOSPITAL_BASED_OUTPATIENT_CLINIC_OR_DEPARTMENT_OTHER): Payer: Self-pay | Admitting: Emergency Medicine

## 2021-11-10 DIAGNOSIS — Z794 Long term (current) use of insulin: Secondary | ICD-10-CM | POA: Diagnosis not present

## 2021-11-10 DIAGNOSIS — Z7984 Long term (current) use of oral hypoglycemic drugs: Secondary | ICD-10-CM | POA: Diagnosis not present

## 2021-11-10 DIAGNOSIS — Z79899 Other long term (current) drug therapy: Secondary | ICD-10-CM | POA: Diagnosis not present

## 2021-11-10 DIAGNOSIS — I10 Essential (primary) hypertension: Secondary | ICD-10-CM | POA: Insufficient documentation

## 2021-11-10 DIAGNOSIS — U071 COVID-19: Secondary | ICD-10-CM | POA: Diagnosis not present

## 2021-11-10 DIAGNOSIS — E119 Type 2 diabetes mellitus without complications: Secondary | ICD-10-CM | POA: Diagnosis not present

## 2021-11-10 DIAGNOSIS — J45909 Unspecified asthma, uncomplicated: Secondary | ICD-10-CM | POA: Insufficient documentation

## 2021-11-10 DIAGNOSIS — R0981 Nasal congestion: Secondary | ICD-10-CM | POA: Diagnosis present

## 2021-11-10 LAB — RESP PANEL BY RT-PCR (FLU A&B, COVID) ARPGX2
Influenza A by PCR: NEGATIVE
Influenza B by PCR: NEGATIVE
SARS Coronavirus 2 by RT PCR: POSITIVE — AB

## 2021-11-10 MED ORDER — OXYMETAZOLINE HCL 0.05 % NA SOLN
2.0000 | Freq: Two times a day (BID) | NASAL | Status: DC | PRN
  Administered 2021-11-10: 2 via NASAL
  Filled 2021-11-10: qty 30

## 2021-11-10 MED ORDER — MOLNUPIRAVIR EUA 200MG CAPSULE: 4.0000 | ORAL_CAPSULE | Freq: Two times a day (BID) | ORAL | 0 refills | Status: AC

## 2021-11-10 NOTE — ED Provider Notes (Signed)
;  [facility] Provider Note: Tracey Dell, MD, FACEP  CSN: [id number] MRN: [id number] ARRIVAL: 11/10/21 at 0148 ROOM: MH09/MH09   CHIEF COMPLAINT  URI   HISTORY OF PRESENT ILLNESS  11/10/21 3:30 AM Tracey Murphy is a 71 y.o. female 1 day of cold symptoms.  Specifically she has had nasal congestion, scratchy throat, occasional cough and a subjective fever.  She denies shortness of breath, loss of taste or smell, nausea, vomiting or diarrhea.  Symptoms are not severe.  She is having no pain.   Past Medical History:  Diagnosis Date   Arthritis    Asthma    Diabetes mellitus without complication (HCC)    Diverticulitis    Hypercholesteremia    Hypertension    Lupus (HCC)     Past Surgical History:  Procedure Laterality Date   ABDOMINAL HYSTERECTOMY     BLADDER SURGERY     CARPAL TUNNEL RELEASE     ELBOW SURGERY     EYE SURGERY     HIP SURGERY      History reviewed. No pertinent family history.  Social History   Tobacco Use   Smoking status: Never  Substance Use Topics   Alcohol use: No   Drug use: No    Prior to Admission medications   Medication Sig Start Date End Date Taking? Authorizing Provider  molnupiravir EUA (LAGEVRIO) 200 mg CAPS capsule Take 4 capsules (800 mg total) by mouth 2 (two) times daily for 5 days. 11/10/21 11/15/21 Yes Heliodoro Domagalski, MD  azaTHIOprine (IMURAN) 50 MG tablet Take 50 mg by mouth daily.    [provider]  bumetanide (BUMEX) 1 MG tablet Take 1 mg by mouth daily.    [provider]  carvedilol (COREG) 3.125 MG tablet Take 3.125 mg by mouth 2 (two) times daily with a meal.    [provider]  diclofenac sodium (VOLTAREN) 1 % GEL Apply topically 4 (four) times daily.    [provider]  esomeprazole (NEXIUM) 40 MG capsule Take 40 mg by mouth daily at 12 noon.    [provider]  estradiol (ESTRACE) 0.5 MG tablet Take 0.5 mg by mouth daily.    [provider]   fluconazole (DIFLUCAN) 100 MG tablet Take 100 mg by mouth daily.    [provider]  gabapentin (NEURONTIN) 400 MG capsule Take 400 mg by mouth 3 (three) times daily.    [provider]  hydroxychloroquine (PLAQUENIL) 200 MG tablet Take 200 mg by mouth daily.    [provider]  insulin glargine (LANTUS) 100 UNIT/ML injection Inject into the skin at bedtime.    [provider]  insulin lispro (HUMALOG) 100 UNIT/ML injection Inject into the skin 3 (three) times daily before meals.    [provider]  montelukast (SINGULAIR) 10 MG tablet Take 10 mg by mouth at bedtime.    [provider]  OXYBUTYNIN CHLORIDE PO Take by mouth.    [provider]  predniSONE (DELTASONE) 5 MG tablet Take 5 mg by mouth daily with breakfast.    [provider]  ranitidine (ZANTAC) 300 MG capsule Take 300 mg by mouth every evening.    [provider]  rosuvastatin (CRESTOR) 40 MG tablet Take 40 mg by mouth daily.    [provider]  sitaGLIPtin-metformin (JANUMET) 50-500 MG per tablet Take 1 tablet by mouth 2 (two) times daily with a meal.    [provider]    Allergies Codeine  and Penicillins   REVIEW OF SYSTEMS  Negative except as noted here or in the History of Present Illness.   PHYSICAL EXAMINATION  Initial Vital Signs Blood pressure 132/68, pulse 88, temperature 98.2 F (36.8 C), temperature source Oral, resp. rate 14, height 5\' 2"  (1.575 m), weight 54.4 kg, SpO2 100 %.  Examination General: Well-developed, well-nourished female in no acute distress; appearance consistent with age of record HENT: normocephalic; atraumatic; nasal congestion Eyes: Normal appearance Neck: supple Heart: regular rate and rhythm Lungs: clear to auscultation bilaterally Abdomen: soft; nondistended; nontender; bowel sounds present Extremities: No deformity; full range of motion Neurologic: Awake, alert and oriented; motor  function intact in all extremities and symmetric; no facial droop Skin: Warm and dry Psychiatric: Normal mood and affect   RESULTS  Summary of this visit's results, reviewed and interpreted by myself:   EKG Interpretation  Date/Time:    Ventricular Rate:    PR Interval:    QRS Duration:   QT Interval:    QTC Calculation:   R Axis:     Text Interpretation:         Laboratory Studies: Results for orders placed or performed during the hospital encounter of 11/10/21 (from the past 24 hour(s))  Resp Panel by RT-PCR (Flu A&B, Covid) Nasopharyngeal Swab     Status: Abnormal   Collection Time: 11/10/21  1:58 AM   Specimen: Nasopharyngeal Swab; Nasopharyngeal(NP) swabs in vial transport medium  Result Value Ref Range   SARS Coronavirus 2 by RT PCR POSITIVE (A) NEGATIVE   Influenza A by PCR NEGATIVE NEGATIVE   Influenza B by PCR NEGATIVE NEGATIVE   Imaging Studies: No results found.  ED COURSE and MDM  Nursing notes, initial and subsequent vitals signs, including pulse oximetry, reviewed and interpreted by myself.  Vitals:   11/10/21 0155  BP: 132/68  Pulse: 88  Resp: 14  Temp: 98.2 F (36.8 C)  TempSrc: Oral  SpO2: 100%  Weight: 54.4 kg  Height: 5\' 2"  (1.575 m)   Medications  oxymetazoline (AFRIN) 0.05 % nasal spray 2 spray (has no administration in time range)    We will start patient on molnupiravir as she does have risk factors for severe COVID.  PROCEDURES  Procedures   ED DIAGNOSES     ICD-10-CM   1. COVID-19 virus infection  U07.1          Kasiah Manka, MD 11/10/21 [phone]

## 2021-11-10 NOTE — ED Triage Notes (Signed)
Pt with cough, congestion, nasal congestion.

## 2023-08-13 ENCOUNTER — Encounter: Payer: Self-pay | Admitting: Podiatry

## 2023-08-13 ENCOUNTER — Ambulatory Visit (INDEPENDENT_AMBULATORY_CARE_PROVIDER_SITE_OTHER): Payer: 59 | Admitting: Podiatry

## 2023-08-13 DIAGNOSIS — B351 Tinea unguium: Secondary | ICD-10-CM | POA: Diagnosis not present

## 2023-08-13 DIAGNOSIS — E1142 Type 2 diabetes mellitus with diabetic polyneuropathy: Secondary | ICD-10-CM | POA: Diagnosis not present

## 2023-08-13 DIAGNOSIS — M79675 Pain in left toe(s): Secondary | ICD-10-CM | POA: Diagnosis not present

## 2023-08-13 DIAGNOSIS — M2042 Other hammer toe(s) (acquired), left foot: Secondary | ICD-10-CM

## 2023-08-13 DIAGNOSIS — M2041 Other hammer toe(s) (acquired), right foot: Secondary | ICD-10-CM | POA: Diagnosis not present

## 2023-08-13 DIAGNOSIS — M79674 Pain in right toe(s): Secondary | ICD-10-CM

## 2023-08-13 NOTE — Progress Notes (Signed)
  Subjective:  Patient ID: Tracey Murphy, female    DOB: 1951-03-17,   MRN: [id number]  No chief complaint on file.   72 y.o. female presents for concern of thickened elongated and painful nails that are difficult to trim. Requesting to have them trimmed today. Relates burning and tingling in their feet. Patient is diabetic and last A1c was 6.8  PCP:  [facility], [facility]    . Denies any other pedal complaints. Denies n/v/f/c.   Past Medical History:  Diagnosis Date   Arthritis    Asthma    Diabetes mellitus without complication (HCC)    Diverticulitis    Hypercholesteremia    Hypertension    Lupus (HCC)     Objective:  Physical Exam: Vascular: DP/PT pulses 2/4 bilateral. CFT <3 seconds. Absent hair growth on digits. Edema noted to bilateral lower extremities. Xerosis noted bilaterally.  Skin. No lacerations or abrasions bilateral feet. Nails 1-5 bilateral  are thickened discolored and elongated with subungual debris.  Musculoskeletal: MMT 5/5 bilateral lower extremities in DF, PF, Inversion and Eversion. Deceased ROM in DF of ankle joint. Hammertoes bilateral 2-5.  Neurological: Sensation intact to light touch. Protective sensation diminished bilateral.    Assessment:   1. Pain due to onychomycosis of toenails of both feet   2. Type 2 diabetes mellitus with polyneuropathy (HCC)   3. Hammertoe, bilateral      Plan:  Patient was evaluated and treated and all questions answered. -Discussed and educated patient on diabetic foot care, especially with  regards to the vascular, neurological and musculoskeletal systems.  -Stressed the importance of good glycemic control and the detriment of not  controlling glucose levels in relation to the foot. -Discussed supportive shoes at all times and checking feet regularly.  -Mechanically debrided all nails 1-5 bilateral using sterile nail nipper and filed with dremel without incident  -DM shoes ordered.  -Answered all patient  questions -Patient to return  in 3 months for at risk foot care -Patient advised to call the office if any problems or questions arise in the meantime.   Louann Sjogren, DPM

## 2023-08-18 ENCOUNTER — Ambulatory Visit: Payer: 59

## 2023-08-18 DIAGNOSIS — E1142 Type 2 diabetes mellitus with diabetic polyneuropathy: Secondary | ICD-10-CM

## 2023-08-18 DIAGNOSIS — M2041 Other hammer toe(s) (acquired), right foot: Secondary | ICD-10-CM

## 2023-08-18 NOTE — Progress Notes (Signed)
Patient presents to the office today for diabetic shoe and insole measuring.  Patient was measured with brannock device to determine size and width for 1 pair of extra depth shoes and foam casted for 3 pair of insoles.   Documentation of medical necessity will be sent to patient's treating diabetic doctor to verify and sign.   Patient's diabetic provider: Corwin Levins / [place]   Shoes and insoles will be ordered at that time and patient will be notified for an appointment for fitting when they arrive.   Shoe size (per patient): 8.5 Brannock measurement: 8.5-9 Patient shoe selection- Shoe choice:   [id number] / [id number] Shoe size ordered:  Financials signed  Addison Bailey Cped, CFo, CFm

## 2023-10-29 ENCOUNTER — Emergency Department (HOSPITAL_BASED_OUTPATIENT_CLINIC_OR_DEPARTMENT_OTHER)
Admission: EM | Admit: 2023-10-29 | Discharge: 2023-10-29 | Disposition: A | Discharge status: Home / Self Care | Payer: Medicare HMO | Attending: Emergency Medicine | Admitting: Emergency Medicine

## 2023-10-29 ENCOUNTER — Other Ambulatory Visit: Payer: Self-pay

## 2023-10-29 ENCOUNTER — Encounter (HOSPITAL_BASED_OUTPATIENT_CLINIC_OR_DEPARTMENT_OTHER): Payer: Self-pay

## 2023-10-29 DIAGNOSIS — M62838 Other muscle spasm: Secondary | ICD-10-CM | POA: Diagnosis not present

## 2023-10-29 DIAGNOSIS — M898X1 Other specified disorders of bone, shoulder: Secondary | ICD-10-CM | POA: Insufficient documentation

## 2023-10-29 DIAGNOSIS — M546 Pain in thoracic spine: Secondary | ICD-10-CM | POA: Diagnosis present

## 2023-10-29 MED ORDER — METHOCARBAMOL 500 MG PO TABS
750.0000 mg | ORAL_TABLET | Freq: Once | ORAL | Status: AC
  Administered 2023-10-29: 750 mg via ORAL
  Filled 2023-10-29: qty 2

## 2023-10-29 MED ORDER — IBUPROFEN 200 MG PO TABS
200.0000 mg | ORAL_TABLET | Freq: Once | ORAL | Status: AC
  Administered 2023-10-29: 200 mg via ORAL
  Filled 2023-10-29: qty 1

## 2023-10-29 MED ORDER — METHOCARBAMOL 750 MG PO TABS: 750.0000 mg | ORAL_TABLET | Freq: Three times a day (TID) | ORAL | 0 refills | Status: AC | PRN

## 2023-10-29 NOTE — ED Notes (Signed)
Pt. Was seen by EDP and treated according to her symptoms

## 2023-10-29 NOTE — ED Triage Notes (Signed)
In for eval of pain under right scapula. Onset 1 year ago. Area tender to palpation and pain worse with deep breathing.

## 2023-10-29 NOTE — ED Provider Notes (Signed)
[facility] Provider Note   CSN: [id number] Arrival date & time: 10/29/23  1404     History  Chief Complaint  Patient presents with   Back Pain    GERALYNN Murphy is a 73 y.o. female.  Patient c/o pain along medial and inferior region of right scapula for the past year or so. Denies recent injury or abrupt worsening. Pain dull, non radiating, worse w certain movements of right shoulder/arm/torso. Hx ddd. Denies 'anterior' pain, no chest pain. No pleuritic pain. No sob. No cough or uri symptoms. No fever or chills. No extremity numbness/weakness. Has not noticed swelling, redness, or skin lesions to area of pain. Took her normal pain medication this AM, but nothing since.   The history is provided by the patient, medical records and a relative.  Back Pain Associated symptoms: no chest pain, no dysuria, no fever, no headaches, no numbness and no weakness        Home Medications Prior to Admission medications   Medication Sig Start Date End Date Taking? Authorizing Provider  azaTHIOprine (IMURAN) 50 MG tablet Take 50 mg by mouth daily.    [provider]  bumetanide (BUMEX) 1 MG tablet Take 1 mg by mouth daily.    [provider]  carvedilol (COREG) 3.125 MG tablet Take 3.125 mg by mouth 2 (two) times daily with a meal.    [provider]  diclofenac sodium (VOLTAREN) 1 % GEL Apply topically 4 (four) times daily.    [provider]  esomeprazole (NEXIUM) 40 MG capsule Take 40 mg by mouth daily at 12 noon.    [provider]  estradiol (ESTRACE) 0.5 MG tablet Take 0.5 mg by mouth daily.    [provider]  fluconazole (DIFLUCAN) 100 MG tablet Take 100 mg by mouth daily.    [provider]  gabapentin (NEURONTIN) 400 MG capsule Take 400 mg by mouth 3 (three) times daily.    [provider]  hydroxychloroquine (PLAQUENIL) 200 MG tablet Take 200 mg by mouth daily.     [provider]  insulin glargine (LANTUS) 100 UNIT/ML injection Inject into the skin at bedtime.    [provider]  insulin lispro (HUMALOG) 100 UNIT/ML injection Inject into the skin 3 (three) times daily before meals.    [provider]  montelukast (SINGULAIR) 10 MG tablet Take 10 mg by mouth at bedtime.    [provider]  OXYBUTYNIN CHLORIDE PO Take by mouth.    [provider]  predniSONE (DELTASONE) 5 MG tablet Take 5 mg by mouth daily with breakfast.    [provider]  ranitidine (ZANTAC) 300 MG capsule Take 300 mg by mouth every evening.    [provider]  rosuvastatin (CRESTOR) 40 MG tablet Take 40 mg by mouth daily.    [provider]  sitaGLIPtin-metformin (JANUMET) 50-500 MG per tablet Take 1 tablet by mouth 2 (two) times daily with a meal.    [provider]      Allergies    Codeine and Penicillins    Review of Systems   Review of Systems  Constitutional:  Negative for chills and fever.  HENT:  Negative for sore throat.   Respiratory:  Negative for cough and shortness of breath.   Cardiovascular:  Negative for chest pain and leg swelling.  Gastrointestinal:  Negative for nausea and vomiting.  Genitourinary:  Negative for dysuria.  Musculoskeletal:  Positive for back pain.  Negative for neck pain.  Skin:  Negative for rash and wound.  Neurological:  Negative for weakness, numbness and headaches.    Physical Exam Updated Vital Signs BP 122/67 (BP Location: Left Arm)   Pulse 79   Temp 98 F (36.7 C) (Oral)   Resp 16   Ht 1.524 m (5')   Wt 63.5 kg   SpO2 100%   BMI 27.34 kg/m  Physical Exam Vitals and nursing note reviewed.  Constitutional:      Appearance: Normal appearance. She is well-developed.  HENT:     Head: Atraumatic.     Nose: Nose normal.  Eyes:     General: No scleral icterus.    Conjunctiva/sclera: Conjunctivae normal.  Neck:     Trachea: No tracheal deviation.   Cardiovascular:     Rate and Rhythm: Normal rate and regular rhythm.     Pulses: Normal pulses.     Heart sounds: Normal heart sounds. No murmur heard.    No friction rub. No gallop.  Pulmonary:     Effort: Pulmonary effort is normal. No respiratory distress.     Breath sounds: Normal breath sounds.  Abdominal:     General: There is no distension.     Palpations: Abdomen is soft.     Tenderness: There is no abdominal tenderness.  Genitourinary:    Comments: No cva tenderness.  Musculoskeletal:        General: No swelling.     Cervical back: Neck supple. No rigidity. No muscular tenderness.     Comments: C/T/L spine non tender, aligned, no step off. Tenderness/muscle spasm around medial and inferior border of right scapula. No sts noted.  No skin lesions or rash.   Skin:    General: Skin is warm and dry.     Findings: No rash.  Neurological:     Mental Status: She is alert.     Comments: Alert, speech normal. Motor/sens grossly intact bil.   Psychiatric:        Mood and Affect: Mood normal.     ED Results / Procedures / Treatments   Labs (all labs ordered are listed, but only abnormal results are displayed) Labs Reviewed - No data to display  EKG None  Radiology No results found.  Procedures Procedures    Medications Ordered in ED Medications  ibuprofen (ADVIL) tablet 200 mg (has no administration in time range)  methocarbamol (ROBAXIN) tablet 750 mg (has no administration in time range)    ED Course/ Medical Decision Making/ A&P                                 Medical Decision Making Problems Addressed: Acute right-sided thoracic back pain: acute illness or injury    Details: Acute/chronic Muscle spasm: acute illness or injury    Details: Acute/chronic Pain of right scapula: acute illness or injury    Details: Acute/chronic  Amount and/or Complexity of Data Reviewed External Data Reviewed: notes.  Risk OTC drugs. Prescription drug  management.   Reviewed nursing notes and prior charts for additional history. Prior imaging reviewed - chronic degen changes/ddd noted. Prior abd imaging, no aaa.   Hx/exam felt most c/w musculoskeletal pain.   No meds since this AM.  Pt has ride/family. Ibuprofen po. Robaxin po.   Pt appears stable for d/c.   Rec pcp f/u.   Return precautions provided.         Final  Clinical Impression(s) / ED Diagnoses Final diagnoses:  None    Rx / DC Orders ED Discharge Orders     None         Cathren Laine, MD 10/29/23 1642

## 2023-10-29 NOTE — Discharge Instructions (Addendum)
It was our pleasure to provide your ER care today - we hope that you feel better.  You may take ibuprofen as need for pain (or try your voltaren gel to sore area). You may also take your pain medication as need.   You may also try taking robaxin as need to help with muscular pain/spasm - we sent a prescription to your pharmacy. No driving when taking.   You may also try heat therapy, and/or gentle massage of sore area.   Follow up closely with your primary care doctor in the next 1-2 weeks.   Return to ER if worse, new symptoms, fevers, increased swelling/redness, severe/intractable pain, numbness/weakness, or other concern.

## 2023-11-13 ENCOUNTER — Telehealth: Payer: Self-pay

## 2023-11-13 DIAGNOSIS — M2042 Other hammer toe(s) (acquired), left foot: Secondary | ICD-10-CM

## 2023-11-13 DIAGNOSIS — M2141 Flat foot [pes planus] (acquired), right foot: Secondary | ICD-10-CM

## 2023-11-13 NOTE — Telephone Encounter (Signed)
Called pt but kept getting busy tone.

## 2023-11-17 ENCOUNTER — Ambulatory Visit (INDEPENDENT_AMBULATORY_CARE_PROVIDER_SITE_OTHER): Payer: 59 | Admitting: Podiatry

## 2023-11-17 ENCOUNTER — Encounter: Payer: Self-pay | Admitting: Podiatry

## 2023-11-17 DIAGNOSIS — B351 Tinea unguium: Secondary | ICD-10-CM

## 2023-11-17 DIAGNOSIS — M79674 Pain in right toe(s): Secondary | ICD-10-CM | POA: Diagnosis not present

## 2023-11-17 DIAGNOSIS — M79675 Pain in left toe(s): Secondary | ICD-10-CM | POA: Diagnosis not present

## 2023-11-17 DIAGNOSIS — E1142 Type 2 diabetes mellitus with diabetic polyneuropathy: Secondary | ICD-10-CM | POA: Diagnosis not present

## 2023-11-17 NOTE — Progress Notes (Signed)
  Subjective:  Patient ID: Tracey Murphy, female    DOB: 1951-08-27,   MRN: [id number]  No chief complaint on file.   73 y.o. female presents for concern of thickened elongated and painful nails that are difficult to trim. Requesting to have them trimmed today. Relates burning and tingling in their feet. Patient is diabetic and last A1c was 6.8  PCP:  [facility]    . Denies any other pedal complaints. Denies n/v/f/c.   Past Medical History:  Diagnosis Date   Arthritis    Asthma    Diabetes mellitus without complication (HCC)    Diverticulitis    Hypercholesteremia    Hypertension    Lupus     Objective:  Physical Exam: Vascular: DP/PT pulses 2/4 bilateral. CFT <3 seconds. Absent hair growth on digits. Edema noted to bilateral lower extremities. Xerosis noted bilaterally.  Skin. No lacerations or abrasions bilateral feet. Nails 1-5 bilateral  are thickened discolored and elongated with subungual debris.  Musculoskeletal: MMT 5/5 bilateral lower extremities in DF, PF, Inversion and Eversion. Deceased ROM in DF of ankle joint. Hammertoes bilateral 2-5.  Neurological: Sensation intact to light touch. Protective sensation diminished bilateral.    Assessment:   1. Pain due to onychomycosis of toenails of both feet   2. Type 2 diabetes mellitus with polyneuropathy (HCC)      Plan:  Patient was evaluated and treated and all questions answered. -Discussed and educated patient on diabetic foot care, especially with  regards to the vascular, neurological and musculoskeletal systems.  -Stressed the importance of good glycemic control and the detriment of not  controlling glucose levels in relation to the foot. -Discussed supportive shoes at all times and checking feet regularly.  -Mechanically debrided all nails 1-5 bilateral using sterile nail nipper and filed with dremel without incident  -Awaiting DM shoes  -Answered all patient questions -Patient to return  in 3  months for at risk foot care -Patient advised to call the office if any problems or questions arise in the meantime.   Louann Sjogren, DPM

## 2024-02-18 ENCOUNTER — Telehealth: Payer: Self-pay

## 2024-02-18 NOTE — Telephone Encounter (Signed)
 Ppw recevd from treating dr for shoes new order placed with [facility] faxed completed ppw to [facility] will call patient for fitting when in

## 2024-02-24 ENCOUNTER — Ambulatory Visit (INDEPENDENT_AMBULATORY_CARE_PROVIDER_SITE_OTHER): Payer: 59 | Admitting: Podiatry

## 2024-02-24 ENCOUNTER — Encounter: Payer: Self-pay | Admitting: Podiatry

## 2024-02-24 DIAGNOSIS — M79675 Pain in left toe(s): Secondary | ICD-10-CM

## 2024-02-24 DIAGNOSIS — M79674 Pain in right toe(s): Secondary | ICD-10-CM | POA: Diagnosis not present

## 2024-02-24 DIAGNOSIS — E1142 Type 2 diabetes mellitus with diabetic polyneuropathy: Secondary | ICD-10-CM | POA: Diagnosis not present

## 2024-02-24 DIAGNOSIS — B351 Tinea unguium: Secondary | ICD-10-CM

## 2024-02-24 NOTE — Progress Notes (Signed)
  Subjective:  Patient ID: Tracey Murphy, female    DOB: 08-22-1951,   MRN: [id number]  Chief Complaint  Patient presents with   Diabetes    "Trim my nails."      73 y.o. female presents for concern of thickened elongated and painful nails that are difficult to trim. Requesting to have them trimmed today. Relates burning and tingling in their feet. Patient is diabetic and last A1c was 6.8  PCP:  [facility], [facility]    . Denies any other pedal complaints. Denies n/v/f/c.   Past Medical History:  Diagnosis Date   Arthritis    Asthma    Diabetes mellitus without complication (HCC)    Diverticulitis    Hypercholesteremia    Hypertension    Lupus     Objective:  Physical Exam: Vascular: DP/PT pulses 2/4 bilateral. CFT <3 seconds. Absent hair growth on digits. Edema noted to bilateral lower extremities. Xerosis noted bilaterally.  Skin. No lacerations or abrasions bilateral feet. Nails 1-5 bilateral  are thickened discolored and elongated with subungual debris.  Musculoskeletal: MMT 5/5 bilateral lower extremities in DF, PF, Inversion and Eversion. Deceased ROM in DF of ankle joint. Hammertoes bilateral 2-5.  Neurological: Sensation intact to light touch. Protective sensation diminished bilateral.    Assessment:   1. Pain due to onychomycosis of toenails of both feet   2. Type 2 diabetes mellitus with polyneuropathy (HCC)      Plan:  Patient was evaluated and treated and all questions answered. -Discussed and educated patient on diabetic foot care, especially with  regards to the vascular, neurological and musculoskeletal systems.  -Stressed the importance of good glycemic control and the detriment of not  controlling glucose levels in relation to the foot. -Discussed supportive shoes at all times and checking feet regularly.  -Mechanically debrided all nails 1-5 bilateral using sterile nail nipper and filed with dremel without incident  -Answered all patient  questions -Patient to return  in 3 months for at risk foot care -Patient advised to call the office if any problems or questions arise in the meantime.   Jennefer Moats, DPM

## 2024-03-16 ENCOUNTER — Other Ambulatory Visit

## 2024-03-18 ENCOUNTER — Ambulatory Visit

## 2024-03-18 NOTE — Progress Notes (Signed)
 Shoe were too big inserts kept here and shoes re-ordred in 1 sz smaller appt for re-fit on 03/31/24 at 9:45

## 2024-03-31 ENCOUNTER — Other Ambulatory Visit

## 2024-04-26 ENCOUNTER — Ambulatory Visit (INDEPENDENT_AMBULATORY_CARE_PROVIDER_SITE_OTHER)

## 2024-04-26 DIAGNOSIS — M2141 Flat foot [pes planus] (acquired), right foot: Secondary | ICD-10-CM | POA: Diagnosis not present

## 2024-04-26 DIAGNOSIS — E1142 Type 2 diabetes mellitus with diabetic polyneuropathy: Secondary | ICD-10-CM

## 2024-04-26 DIAGNOSIS — M2142 Flat foot [pes planus] (acquired), left foot: Secondary | ICD-10-CM

## 2024-04-26 DIAGNOSIS — M2042 Other hammer toe(s) (acquired), left foot: Secondary | ICD-10-CM | POA: Diagnosis not present

## 2024-04-26 DIAGNOSIS — M2041 Other hammer toe(s) (acquired), right foot: Secondary | ICD-10-CM | POA: Diagnosis not present

## 2024-04-27 NOTE — Progress Notes (Signed)

## 2024-05-26 ENCOUNTER — Ambulatory Visit: Admitting: Podiatry

## 2024-06-07 ENCOUNTER — Ambulatory Visit: Admitting: Podiatry

## 2024-07-27 ENCOUNTER — Ambulatory Visit: Admitting: Podiatry

## 2024-08-02 ENCOUNTER — Ambulatory Visit: Admitting: Podiatry

## 2024-08-02 ENCOUNTER — Encounter: Payer: Self-pay | Admitting: Podiatry

## 2024-08-02 DIAGNOSIS — B351 Tinea unguium: Secondary | ICD-10-CM | POA: Diagnosis not present

## 2024-08-02 DIAGNOSIS — M79674 Pain in right toe(s): Secondary | ICD-10-CM

## 2024-08-02 DIAGNOSIS — E1142 Type 2 diabetes mellitus with diabetic polyneuropathy: Secondary | ICD-10-CM | POA: Diagnosis not present

## 2024-08-02 DIAGNOSIS — M79675 Pain in left toe(s): Secondary | ICD-10-CM | POA: Diagnosis not present

## 2024-08-02 NOTE — Progress Notes (Signed)
  Subjective:  Patient ID: Tracey Murphy, female    DOB: [birth year],   MRN: [id number]  Chief Complaint  Patient presents with   Diabetes    Clip my toenails.  Saw Dr. Elsie Sharps - 05/05/2024; A1c - 6.7    73 y.o. female presents for concern of thickened elongated and painful nails that are difficult to trim. Requesting to have them trimmed today. Relates burning and tingling in their feet. Patient is diabetic and last A1c was 6.8  PCP:  Neita Dover, PA-C    . Denies any other pedal complaints. Denies n/v/f/c.   Past Medical History:  Diagnosis Date   Arthritis    Asthma    Diabetes mellitus without complication (HCC)    Diverticulitis    Hypercholesteremia    Hypertension    Lupus     Objective:  Physical Exam: Vascular: DP/PT pulses 2/4 bilateral. CFT <3 seconds. Absent hair growth on digits. Edema noted to bilateral lower extremities. Xerosis noted bilaterally.  Skin. No lacerations or abrasions bilateral feet. Nails 1-5 bilateral  are thickened discolored and elongated with subungual debris.  Musculoskeletal: MMT 5/5 bilateral lower extremities in DF, PF, Inversion and Eversion. Deceased ROM in DF of ankle joint. Hammertoes bilateral 2-5.  Neurological: Sensation intact to light touch. Protective sensation diminished bilateral.    Assessment:   1. Pain due to onychomycosis of toenails of both feet   2. Type 2 diabetes mellitus with polyneuropathy (HCC)      Plan:  Patient was evaluated and treated and all questions answered. -Discussed and educated patient on diabetic foot care, especially with  regards to the vascular, neurological and musculoskeletal systems.  -Stressed the importance of good glycemic control and the detriment of not  controlling glucose levels in relation to the foot. -Discussed supportive shoes at all times and checking feet regularly.  -Mechanically debrided all nails 1-5 bilateral using sterile nail nipper and filed with dremel  without incident  -Answered all patient questions -Patient to return  in 3 months for at risk foot care -Patient advised to call the office if any problems or questions arise in the meantime.   Asberry Failing, DPM

## 2024-10-26 ENCOUNTER — Other Ambulatory Visit (HOSPITAL_COMMUNITY): Payer: Self-pay | Admitting: *Deleted

## 2024-10-26 DIAGNOSIS — I2510 Atherosclerotic heart disease of native coronary artery without angina pectoris: Secondary | ICD-10-CM

## 2024-11-02 ENCOUNTER — Ambulatory Visit: Admitting: Podiatry
